# Patient Record
Sex: Male | Born: 1990 | Race: White | Hispanic: No | Marital: Married | Smoking: Former smoker
Health system: Southern US, Community
[De-identification: ages and names within clinical notes are randomized; demographics above are authoritative.]

---
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
## 2018-02-01 HISTORY — PX: EXCISION MELANOMA WITH SENTINEL LYMPH NODE BIOPSY: SHX5628

## 2018-02-01 HISTORY — PX: TOE AMPUTATION: SHX809

## 2018-02-03 ENCOUNTER — Encounter (HOSPITAL_COMMUNITY): Payer: Self-pay

## 2018-02-03 ENCOUNTER — Emergency Department (HOSPITAL_COMMUNITY): Payer: BLUE CROSS/BLUE SHIELD

## 2018-02-03 ENCOUNTER — Emergency Department (HOSPITAL_COMMUNITY)
Admission: EM | Admit: 2018-02-03 | Discharge: 2018-02-03 | Disposition: A | Discharge status: Home / Self Care | Payer: BLUE CROSS/BLUE SHIELD | Attending: Emergency Medicine | Admitting: Emergency Medicine

## 2018-02-03 ENCOUNTER — Other Ambulatory Visit: Payer: Self-pay

## 2018-02-03 DIAGNOSIS — Z89422 Acquired absence of other left toe(s): Secondary | ICD-10-CM | POA: Insufficient documentation

## 2018-02-03 DIAGNOSIS — C4372 Malignant melanoma of left lower limb, including hip: Secondary | ICD-10-CM | POA: Insufficient documentation

## 2018-02-03 DIAGNOSIS — F17220 Nicotine dependence, chewing tobacco, uncomplicated: Secondary | ICD-10-CM | POA: Insufficient documentation

## 2018-02-03 DIAGNOSIS — G8918 Other acute postprocedural pain: Secondary | ICD-10-CM | POA: Diagnosis present

## 2018-02-03 DIAGNOSIS — L089 Local infection of the skin and subcutaneous tissue, unspecified: Secondary | ICD-10-CM

## 2018-02-03 LAB — BASIC METABOLIC PANEL
ANION GAP: 11 (ref 5–15)
CALCIUM: 9.7 mg/dL (ref 8.9–10.3)
CO2: 23 mmol/L (ref 22–32)
Chloride: 103 mmol/L (ref 101–111)
Creatinine, Ser: 0.72 mg/dL (ref 0.61–1.24)
GFR calc Af Amer: >60 mL/min (ref >60)
GFR calc non Af Amer: >60 mL/min (ref >60)
GLUCOSE: 106 mg/dL — AB (ref 65–99)
Potassium: 3.6 mmol/L (ref 3.5–5.1)
Sodium: 137 mmol/L (ref 135–145)

## 2018-02-03 LAB — CBC WITH DIFFERENTIAL/PLATELET
BASOS ABS: 0 10*3/uL (ref 0.0–0.1)
Basophils Relative: 0 %
Eosinophils Absolute: 0.4 10*3/uL (ref 0.0–0.7)
Eosinophils Relative: 4 %
HEMATOCRIT: 46.6 % (ref 39.0–52.0)
Hemoglobin: 16.1 g/dL (ref 13.0–17.0)
LYMPHS ABS: 2 10*3/uL (ref 0.7–4.0)
LYMPHS PCT: 19 %
MCH: 29.5 pg (ref 26.0–34.0)
MCHC: 34.5 g/dL (ref 30.0–36.0)
MCV: 85.5 fL (ref 78.0–100.0)
MONO ABS: 0.8 10*3/uL (ref 0.1–1.0)
MONOS PCT: 8 %
NEUTROS ABS: 7.1 10*3/uL (ref 1.7–7.7)
Neutrophils Relative %: 69 %
Platelets: 264 10*3/uL (ref 150–400)
RBC: 5.45 MIL/uL (ref 4.22–5.81)
RDW: 12.7 % (ref 11.5–15.5)
WBC: 10.3 10*3/uL (ref 4.0–10.5)

## 2018-02-03 LAB — I-STAT CG4 LACTIC ACID, ED: Lactic Acid, Venous: 0.71 mmol/L (ref 0.5–1.9)

## 2018-02-03 MED ORDER — VANCOMYCIN HCL IN DEXTROSE 1-5 GM/200ML-% IV SOLN
1000.0000 mg | Freq: Once | INTRAVENOUS | Status: AC
  Administered 2018-02-03: 1000 mg via INTRAVENOUS
  Filled 2018-02-03: qty 200

## 2018-02-03 MED ORDER — OXYCODONE-ACETAMINOPHEN 5-325 MG PO TABS
1.0000 | ORAL_TABLET | Freq: Once | ORAL | Status: AC
  Administered 2018-02-03: 1 via ORAL
  Filled 2018-02-03: qty 1

## 2018-02-03 MED ORDER — MORPHINE SULFATE (PF) 4 MG/ML IV SOLN
4.0000 mg | Freq: Once | INTRAVENOUS | Status: AC | PRN
  Administered 2018-02-03: 4 mg via INTRAVENOUS
  Filled 2018-02-03: qty 1

## 2018-02-03 MED ORDER — AMOXICILLIN-POT CLAVULANATE 875-125 MG PO TABS: 1.0000 | ORAL_TABLET | Freq: Two times a day (BID) | ORAL | 0 refills | Status: AC

## 2018-02-03 NOTE — ED Triage Notes (Signed)
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  Pt was prescribed hydrocodone per surgeon.  Pt reports pain is a 10/10 spasming/sharp pain that is relieved for 15-62min with hydrocodone then extreme pain returns.  Pt states "surgeon wanted me to come here for possible pain control, as my pain should be better controlled by now."

## 2018-02-03 NOTE — Discharge Instructions (Signed)
It was my pleasure taking care of you today!   Take antibiotic as directed.   I have spoken with Dr. Carlis Abbott tonight - the surgeon on call for Dr. Crisoforo Oxford. He will arrange for you to be seen for wound check in the clinic tomorrow. If you do not hear from them, please call to inquire about follow up appointment.   Return to ER for new or worsening symptoms, any additional concerns.

## 2018-02-03 NOTE — ED Provider Notes (Signed)
[facility] EMERGENCY DEPARTMENT Provider Note   CSN: [id number] Arrival date & time: 02/03/18  1903     History   Chief Complaint Chief Complaint  Patient presents with  . Foot Pain    HPI Dylan Landry is a 27 y.o. male.  The history is provided by the patient and medical records. No language interpreter was used.  Foot Pain    Dylan Landry is a 27 y.o. male  who presents to the Emergency Department complaining of left 5th toe pain. 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 He underwent procedure on 3/25 (two days ago) by Dr. Jyl Heinz at [facility] along with left groin node biopsy. He began having significant amount of pain last night which was not relieved with his home norco rx given. He called surgeon today who informed him that pain should be getting better, not worse, therefore recommended he come to ER for further evaluation. He reports pain as 10/10, spasm-like pain. He is unsure of color change of the wound. He has not taken the original dressing off since he originally changed dressing when he first came home. Denies any pain radiating up the leg. No fevers, chills. No abdominal pain, chest pain or shortness of berath .  No past medical history on file.  There are no active problems to display for this patient.      Home Medications    Prior to Admission medications   Medication Sig Start Date End Date Taking? Authorizing Provider  HYDROcodone-acetaminophen (NORCO/VICODIN) 5-325 MG tablet Take 1 tablet by mouth every 4 (four) hours as needed for moderate pain.   Yes [provider]  lansoprazole (PREVACID) 15 MG capsule Take 15 mg by mouth daily.    Yes [provider]  levocetirizine (XYZAL) 5 MG tablet Take 5 mg by mouth daily.   Yes [provider]  tolnaftate (TINACTIN) 1 % spray Apply 1 spray topically 2 (two) times daily as needed (for athlete's foot).   Yes [provider]    Family History Family History  Problem Relation Age of Onset  . Hyperlipidemia Mother   . Diabetes Father   . Asthma Father   . Hyperlipidemia Brother     Social History Social History   Tobacco Use  . Smoking status: Former Smoker    Packs/day: 0.50    Years: 10.00    Pack years: 5.00    Types: Cigarettes  . Smokeless tobacco: Current User    Types: Chew  Substance Use Topics  . Alcohol use: Yes    Alcohol/week: 1.2 oz    Types: 2 Cans of beer per week  . Drug use: Never     Allergies   Other; Lactase; and Wheat bran   Review of Systems Review of Systems  Musculoskeletal: Positive for arthralgias and myalgias.  Skin: Positive for wound.  All other systems reviewed and are negative.    Physical Exam Updated Vital Signs BP 126/70   Pulse 80   Temp 98.2 F (36.8 C) (Oral)   Resp 16   Ht 6' (1.829 m)   Wt 68 kg (150 lb)   SpO2 99%   BMI 20.34 kg/m   Physical Exam  Constitutional: He is oriented to person, place, and time. He appears well-developed and well-nourished. No distress.  HENT:  Head: Normocephalic and atraumatic.  Cardiovascular: Normal rate, regular rhythm and normal heart sounds.  No murmur heard.  Pulmonary/Chest: Effort normal and breath sounds normal. No respiratory distress.  Abdominal: Soft. He exhibits no distension. There is no tenderness.  Musculoskeletal: He exhibits no edema.  Neurological: He is alert and oriented to person, place, and time.  Skin: Skin is warm and dry.  See image below. Erythematous and very tender to palpation.   Nursing note and vitals reviewed.      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 EXAM: LEFT FOOT - COMPLETE 3+ VIEW COMPARISON:  None. FINDINGS: The left toe is amputated at the PIP joint. The head of the proximal phalanx was partially resected. Dressing is in place. No other acute or focal bone or soft tissue abnormality is present. IMPRESSION: 1. Amputation involving the fifth digit. 2. No other acute abnormality. Electronically Signed   By: San Morelle M.D.   On: 02/03/2018 21:04    Procedures Procedures (including critical care time)  Medications Ordered in ED Medications  morphine 4 MG/ML injection 4 mg (has no administration in time range)  vancomycin (VANCOCIN) IVPB 1000 mg/200 mL premix (1,000 mg Intravenous New Bag/Given 02/03/18 2159)  oxyCODONE-acetaminophen (PERCOCET/ROXICET) 5-325 MG per tablet 1 tablet (1 tablet Oral Given 02/03/18 1940)     Initial Impression / Assessment and Plan / ED Course  I have reviewed the triage vital signs and the nursing notes.  Pertinent labs & imaging results that were available during my care of the patient were reviewed by me and considered in my medical decision making (see chart for details).    Dylan Landry is a 27 y.o. male who presents to ED for acute worsening of left 5th toe pain. 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 He underwent procedure on 3/25 (two days ago) by Dr. Jyl Heinz at [facility].  On exam, patient is afebrile and hemodynamic stable.  Surgical wound does not appear infected.  There is erythema to the surrounding area which is exquisitely tender to touch.  There is also small amount of purulent drainage seeping for suturing.  X-ray obtained and reassuring.  Patient with normal white count and lactic acid as well.  I spoke to the on-call surgeon at [facility], Dr. Carlis Abbott, who will  arrange for patient to follow-up in the clinic tomorrow for wound check.  Will give 1 dose of IV antibiotics and placed on Augmentin as outpatient. Discussed plan of care with patient who understands and agrees with this. All questions answered.   Patient discussed with Dr. Eulis Foster who agrees with treatment plan.    Final Clinical Impressions(s) / ED Diagnoses   Final diagnoses:  Foot infection    ED Discharge Orders    None       Ward, Ozella Almond, PA-C 02/03/18 2216    Daleen Bo, MD 02/06/18 [phone]

## 2018-04-30 ENCOUNTER — Encounter: Payer: Self-pay | Admitting: Family Medicine

## 2019-05-27 IMAGING — CR DG FOOT COMPLETE 3+V*L*
3 series · 3 of 3 positions shown · non-contrast
Comparison: None.

CLINICAL DATA: Spasms and toes following amputation of the fifth
digit for melanoma.

EXAM:
LEFT FOOT - COMPLETE 3+ VIEW

[foot ap]
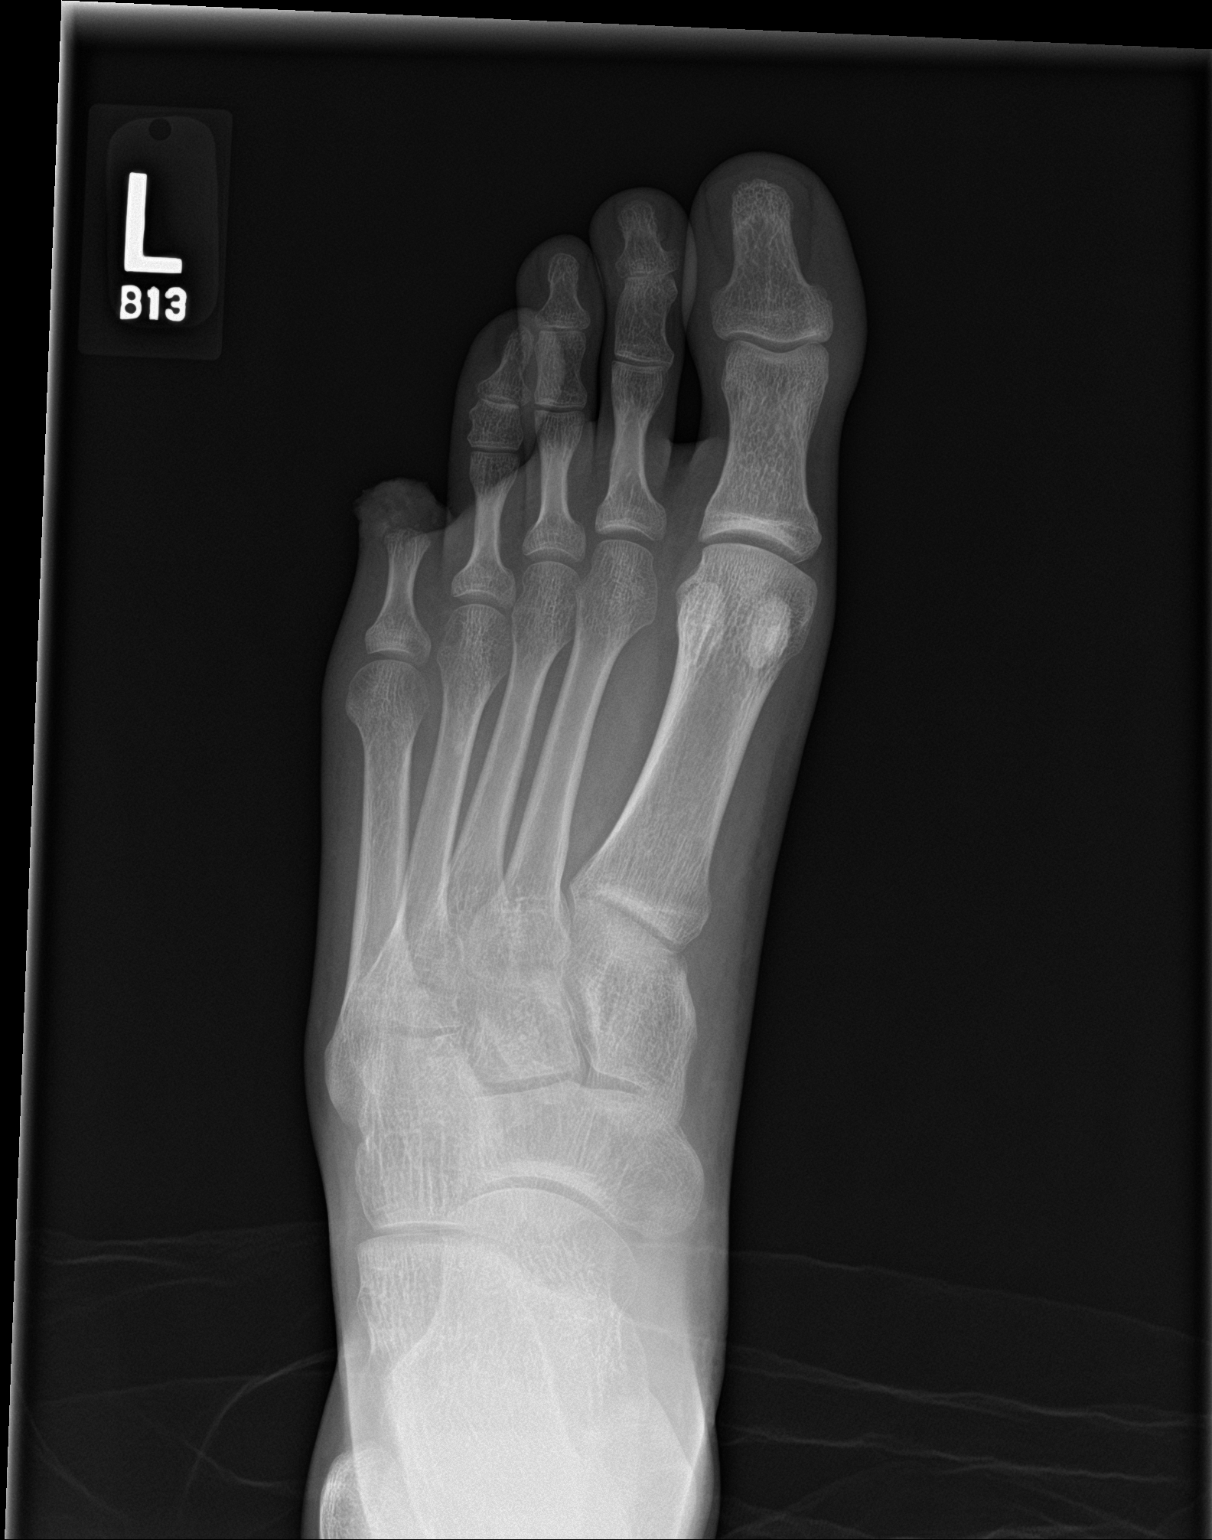

[foot obl]
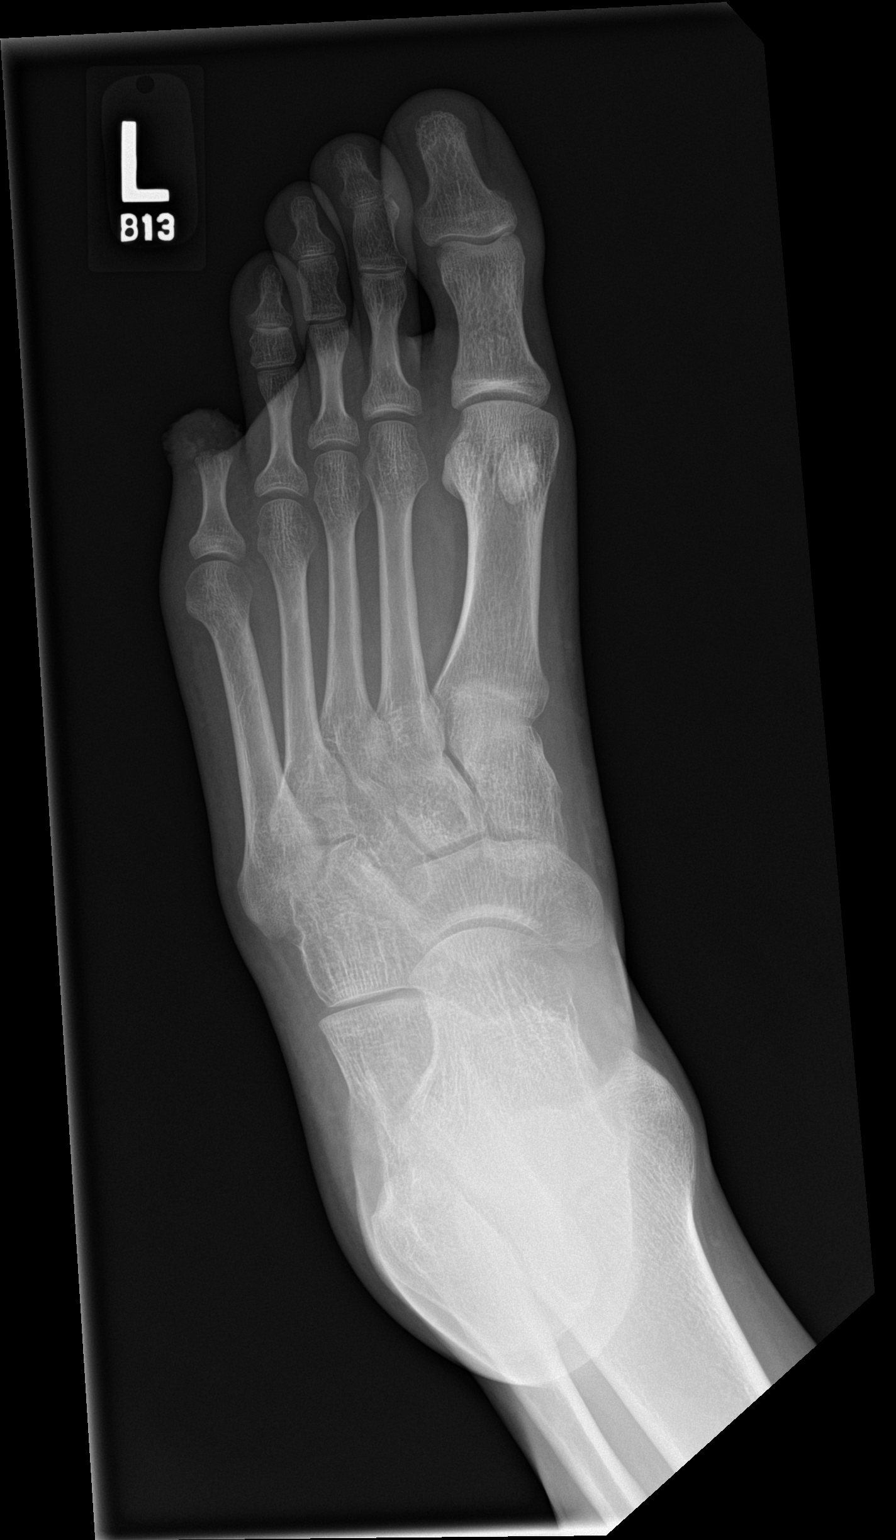

[foot lat]
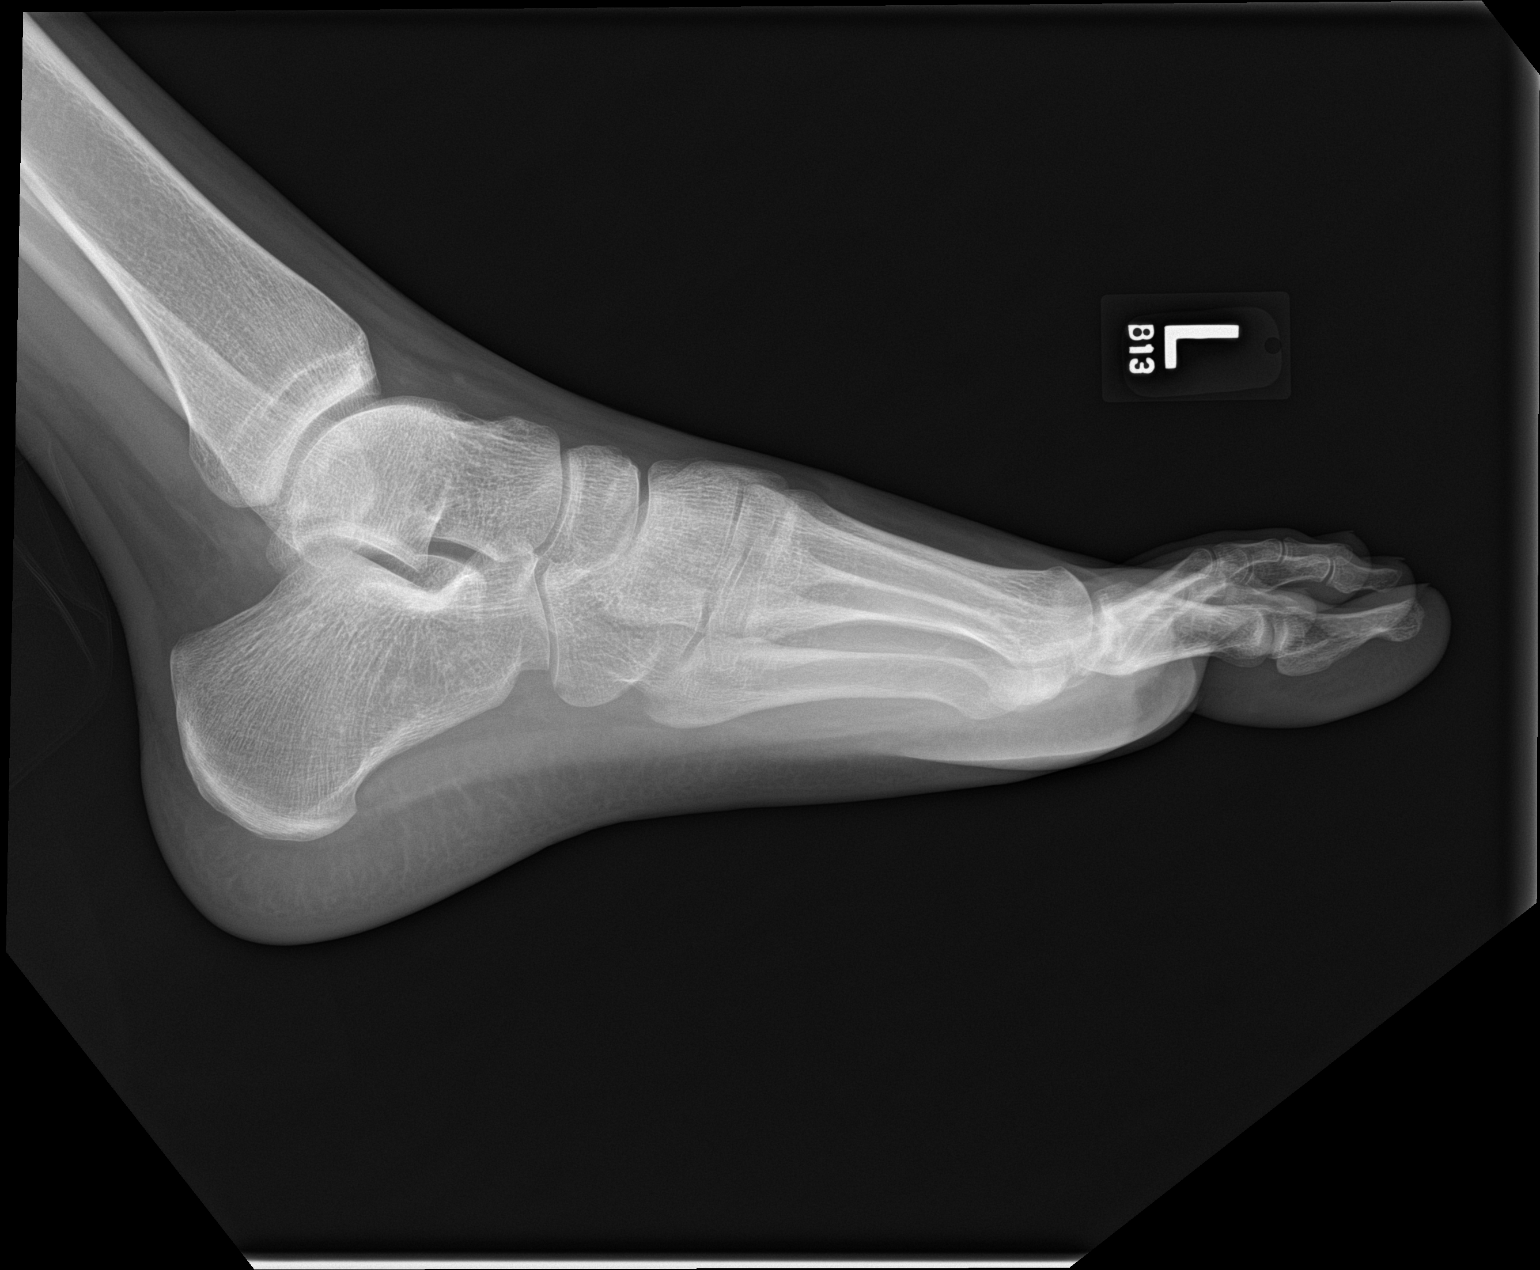

[3 of 3 positions shown; findings below may reference images not displayed]

FINDINGS: The left toe is amputated at the PIP joint. The head of the proximal
phalanx was partially resected. Dressing is in place. No other acute
or focal bone or soft tissue abnormality is present.
IMPRESSION: 1. Amputation involving the fifth digit.
2. No other acute abnormality.
